# Patient Record
Sex: Male | Born: 2000
Health system: Southern US, Community
[De-identification: ages and names within clinical notes are randomized; demographics above are authoritative.]

## PROBLEM LIST (undated history)

## (undated) DIAGNOSIS — W3400XA Accidental discharge from unspecified firearms or gun, initial encounter: Secondary | ICD-10-CM

## (undated) DIAGNOSIS — Z789 Other specified health status: Secondary | ICD-10-CM

---
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
## 2024-07-27 ENCOUNTER — Emergency Department (HOSPITAL_COMMUNITY): Payer: Self-pay | Admitting: Certified Registered Nurse Anesthetist

## 2024-07-27 ENCOUNTER — Inpatient Hospital Stay (HOSPITAL_COMMUNITY)
Admission: EM | Admit: 2024-07-27 | Discharge: 2024-07-29 | DRG: 907 | Disposition: A | Discharge status: Home / Self Care | Payer: Self-pay | Attending: Surgery | Admitting: Surgery

## 2024-07-27 ENCOUNTER — Emergency Department (HOSPITAL_COMMUNITY): Payer: Self-pay

## 2024-07-27 ENCOUNTER — Encounter (HOSPITAL_COMMUNITY): Admission: EM | Disposition: A | Discharge status: Home / Self Care | Payer: Self-pay | Source: Home / Self Care

## 2024-07-27 ENCOUNTER — Encounter (HOSPITAL_COMMUNITY): Payer: Self-pay | Admitting: Emergency Medicine

## 2024-07-27 DIAGNOSIS — Y249XXA Unspecified firearm discharge, undetermined intent, initial encounter: Secondary | ICD-10-CM | POA: Diagnosis present

## 2024-07-27 DIAGNOSIS — T148XXA Other injury of unspecified body region, initial encounter: Secondary | ICD-10-CM | POA: Diagnosis present

## 2024-07-27 DIAGNOSIS — Z23 Encounter for immunization: Secondary | ICD-10-CM

## 2024-07-27 DIAGNOSIS — F1410 Cocaine abuse, uncomplicated: Secondary | ICD-10-CM | POA: Diagnosis present

## 2024-07-27 DIAGNOSIS — S75001A Unspecified injury of femoral artery, right leg, initial encounter: Secondary | ICD-10-CM

## 2024-07-27 DIAGNOSIS — S71101A Unspecified open wound, right thigh, initial encounter: Secondary | ICD-10-CM | POA: Diagnosis not present

## 2024-07-27 DIAGNOSIS — F1510 Other stimulant abuse, uncomplicated: Secondary | ICD-10-CM | POA: Diagnosis present

## 2024-07-27 DIAGNOSIS — Z7982 Long term (current) use of aspirin: Secondary | ICD-10-CM

## 2024-07-27 DIAGNOSIS — D62 Acute posthemorrhagic anemia: Secondary | ICD-10-CM | POA: Diagnosis present

## 2024-07-27 DIAGNOSIS — F1210 Cannabis abuse, uncomplicated: Secondary | ICD-10-CM | POA: Diagnosis present

## 2024-07-27 DIAGNOSIS — S75011A Minor laceration of femoral artery, right leg, initial encounter: Secondary | ICD-10-CM | POA: Diagnosis present

## 2024-07-27 DIAGNOSIS — S75191A Other specified injury of femoral vein at hip and thigh level, right leg, initial encounter: Secondary | ICD-10-CM | POA: Diagnosis not present

## 2024-07-27 DIAGNOSIS — S75091A Other specified injury of femoral artery, right leg, initial encounter: Secondary | ICD-10-CM | POA: Diagnosis not present

## 2024-07-27 HISTORY — PX: BYPASS GRAFT FEMORAL-PERONEAL: SHX5762

## 2024-07-27 HISTORY — PX: FASCIOTOMY: SHX132

## 2024-07-27 HISTORY — PX: VEIN HARVEST: SHX6363

## 2024-07-27 SURGERY — CREATION, BYPASS, ARTERIAL, FEMORAL TO PERONEAL, USING GRAFT
Anesthesia: General | Site: Leg Upper | Laterality: Right

## 2024-07-27 MED ORDER — ACETAMINOPHEN 500 MG PO TABS
1000.0000 mg | ORAL_TABLET | Freq: Four times a day (QID) | ORAL | Status: DC
  Administered 2024-07-27 – 2024-07-29 (×7): 1000 mg via ORAL

## 2024-07-27 MED ORDER — POLYETHYLENE GLYCOL 3350 17 G PO PACK
17.0000 g | PACK | Freq: Every day | ORAL | Status: DC
  Administered 2024-07-27 – 2024-07-29 (×2): 17 g via ORAL

## 2024-07-27 MED ORDER — DOCUSATE SODIUM 100 MG PO CAPS
100.0000 mg | ORAL_CAPSULE | Freq: Two times a day (BID) | ORAL | Status: DC
  Administered 2024-07-27 – 2024-07-29 (×4): 100 mg via ORAL

## 2024-07-27 MED ORDER — METOPROLOL TARTRATE 5 MG/5ML IV SOLN: 5.0000 mg | Freq: Four times a day (QID) | INTRAVENOUS | Status: DC | PRN

## 2024-07-27 MED ORDER — HYDRALAZINE HCL 20 MG/ML IJ SOLN: 10.0000 mg | INTRAMUSCULAR | Status: DC | PRN

## 2024-07-27 MED ORDER — OXYCODONE HCL 5 MG PO TABS
5.0000 mg | ORAL_TABLET | ORAL | Status: DC | PRN
  Administered 2024-07-27 (×2): 10 mg via ORAL
  Administered 2024-07-27: 5 mg via ORAL
  Administered 2024-07-27: 10 mg via ORAL
  Administered 2024-07-28 (×3): 5 mg via ORAL
  Administered 2024-07-29: 10 mg via ORAL

## 2024-07-27 MED ORDER — MORPHINE SULFATE (PF) 2 MG/ML IV SOLN
4.0000 mg | INTRAVENOUS | Status: DC | PRN
  Administered 2024-07-27: 4 mg via INTRAVENOUS

## 2024-07-27 MED ORDER — SODIUM CHLORIDE 0.9 % IV SOLN: 500.0000 mL | Freq: Once | INTRAVENOUS | Status: DC | PRN

## 2024-07-27 MED ORDER — METHOCARBAMOL 1000 MG/10ML IJ SOLN: 500.0000 mg | Freq: Three times a day (TID) | INTRAMUSCULAR | Status: DC

## 2024-07-27 MED ORDER — POLYETHYLENE GLYCOL 3350 17 G PO PACK: 17.0000 g | PACK | Freq: Every day | ORAL | Status: DC | PRN

## 2024-07-27 MED ORDER — GABAPENTIN 300 MG PO CAPS
300.0000 mg | ORAL_CAPSULE | Freq: Three times a day (TID) | ORAL | Status: DC
  Administered 2024-07-27 – 2024-07-29 (×7): 300 mg via ORAL

## 2024-07-27 MED ORDER — ONDANSETRON 4 MG PO TBDP: 4.0000 mg | ORAL_TABLET | Freq: Four times a day (QID) | ORAL | Status: DC | PRN

## 2024-07-27 MED ORDER — LACTATED RINGERS IV SOLN: INTRAVENOUS | Status: DC

## 2024-07-27 MED ORDER — ASPIRIN 81 MG PO TBEC
81.0000 mg | DELAYED_RELEASE_TABLET | Freq: Every day | ORAL | Status: DC
  Administered 2024-07-28 – 2024-07-29 (×2): 81 mg via ORAL

## 2024-07-27 MED ORDER — ONDANSETRON HCL 4 MG/2ML IJ SOLN: 4.0000 mg | Freq: Four times a day (QID) | INTRAMUSCULAR | Status: DC | PRN

## 2024-07-27 MED ORDER — METHOCARBAMOL 500 MG PO TABS
500.0000 mg | ORAL_TABLET | Freq: Three times a day (TID) | ORAL | Status: DC
  Administered 2024-07-27 – 2024-07-29 (×7): 500 mg via ORAL

## 2024-07-27 MED ORDER — ENOXAPARIN SODIUM 30 MG/0.3ML IJ SOSY
30.0000 mg | PREFILLED_SYRINGE | Freq: Two times a day (BID) | INTRAMUSCULAR | Status: DC
  Administered 2024-07-28 – 2024-07-29 (×3): 30 mg via SUBCUTANEOUS

## 2024-07-27 MED ADMIN — Cefazolin Sodium for IV Soln 2 GM and Dextrose 3% (50 ML): 2 g | INTRAVENOUS | NDC 00264310511

## 2024-07-27 MED ADMIN — Fentanyl Citrate Preservative Free (PF) Inj 250 MCG/5ML: 50 ug | INTRAVENOUS | NDC 72572017125

## 2024-07-27 MED ADMIN — Fentanyl Citrate Preservative Free (PF) Inj 250 MCG/5ML: 100 ug | INTRAVENOUS | NDC 72572017125

## 2024-07-27 MED ADMIN — henylephrine-NaCl Pref Syr 0.8 MG/10ML-0.9% (80 MCG/ML): 160 ug | INTRAVENOUS | NDC 65302050510

## 2024-07-27 MED ADMIN — Labetalol HCl IV Soln 5 MG/ML: 10 mg | INTRAVENOUS | NDC 00409233934

## 2024-07-27 MED ADMIN — Midazolam HCl Inj PF 2 MG/2ML (Base Equivalent): 2 mg | INTRAVENOUS | NDC 00409000125

## 2024-07-27 MED ADMIN — Heparin Sodium (Porcine) Inj 1000 Unit/ML: 8000 [IU] | INTRAVENOUS | NDC 71288040211

## 2024-07-27 MED ADMIN — Heparin Sodium (Porcine) Inj 1000 Unit/ML: 2000 [IU] | INTRAVENOUS | NDC 72572025001

## 2024-07-27 MED ADMIN — Lidocaine HCl Local Soln Prefilled Syringe 100 MG/5ML (2%): 60 mg | INTRAVENOUS | NDC 70004072309

## 2024-07-27 MED ADMIN — Fentanyl Citrate PF Soln Prefilled Syringe 50 MCG/ML: 50 ug | INTRAVENOUS | NDC 63323080801

## 2024-07-27 MED ADMIN — PROPOFOL 200 MG/20ML IV EMUL: 200 mg | INTRAVENOUS | NDC 00069020910

## 2024-07-27 MED ADMIN — Succinylcholine Chloride Sol Pref Syr 200 MG/10ML (20 MG/ML): 120 mg | INTRAVENOUS | NDC 99999070035

## 2024-07-27 MED ADMIN — Cefazolin Sodium-Dextrose IV Solution 2 GM/100ML-4%: 2 g | INTRAVENOUS | NDC 00338350841

## 2024-07-27 MED ADMIN — Rocuronium Bromide IV Soln Pref Syr 100 MG/10ML (10 MG/ML): 10 mg | INTRAVENOUS | NDC 99999070048

## 2024-07-27 MED ADMIN — Rocuronium Bromide IV Soln Pref Syr 100 MG/10ML (10 MG/ML): 20 mg | INTRAVENOUS | NDC 99999070048

## 2024-07-27 MED ADMIN — Rocuronium Bromide IV Soln Pref Syr 100 MG/10ML (10 MG/ML): 50 mg | INTRAVENOUS | NDC 99999070048

## 2024-07-27 MED ADMIN — Tet Tox-Diph-Acell Pertuss Ad Inj 5-2-15.5 LF-LF-MCG/0.5ML: 0.5 mL | INTRAMUSCULAR | NDC 49281040020

## 2024-07-27 MED ADMIN — Sugammadex Sodium IV 200 MG/2ML (Base Equivalent): 200 mg | INTRAVENOUS | NDC 00006542302

## 2024-07-27 MED ADMIN — Ondansetron HCl Inj 4 MG/2ML (2 MG/ML): 4 mg | INTRAVENOUS | NDC 60505613005

## 2024-07-27 MED ADMIN — Fentanyl Citrate Preservative Free (PF) Inj 100 MCG/2ML: 50 ug | INTRAVENOUS | NDC 72572017001

## 2024-07-27 MED ADMIN — Dexmedetomidine HCl in NaCl 0.9% IV Soln 80 MCG/20ML: 8 ug | INTRAVENOUS | NDC 00781349395

## 2024-07-27 MED ADMIN — Dexmedetomidine HCl in NaCl 0.9% IV Soln 80 MCG/20ML: 4 ug | INTRAVENOUS | NDC 00781349395

## 2024-07-27 MED ADMIN — Sodium Chloride Irrigation Soln 0.9%: 2000 mL | NDC 99999050048

## 2024-07-27 MED ADMIN — Dexamethasone Sod Phosphate Preservative Free Inj 10 MG/ML: 10 mg | INTRAVENOUS | NDC 25021005301

## 2024-07-27 MED ADMIN — Acetaminophen IV Soln 10 MG/ML: 1000 mg | INTRAVENOUS | NDC 63323043400

## 2024-07-27 MED ADMIN — Sodium Chloride IV Soln 0.9%: 1000 mL | INTRAVENOUS | NDC 00264580200

## 2024-07-27 MED ADMIN — Heparin Sodium (Porcine) Inj 1000 Unit/ML: 1 | NDC 99999070068

## 2024-07-27 MED ADMIN — Iohexol IV Soln 350 MG/ML: 100 mL | INTRAVENOUS | NDC 00407141491

## 2024-07-27 MED ADMIN — Protamine Sulfate Inj 10 MG/ML: 50 mg | INTRAVENOUS | NDC 63323022905

## 2024-07-27 SURGICAL SUPPLY — 46 items
BAG COUNTER SPONGE SURGICOUNT (BAG) ×2 IMPLANT
BNDG COMPR ESMARK 6X3 LF (GAUZE/BANDAGES/DRESSINGS) IMPLANT
BNDG ELASTIC 4INX 5YD STR LF (GAUZE/BANDAGES/DRESSINGS) IMPLANT
BNDG ELASTIC 6INX 5YD STR LF (GAUZE/BANDAGES/DRESSINGS) IMPLANT
BNDG GAUZE DERMACEA FLUFF 4 (GAUZE/BANDAGES/DRESSINGS) IMPLANT
CANISTER SUCTION 3000ML PPV (SUCTIONS) ×2 IMPLANT
CANNULA VESSEL 3MM 2 BLNT TIP (CANNULA) IMPLANT
CHLORAPREP W/TINT 26 (MISCELLANEOUS) ×4 IMPLANT
CUFF TOURN SGL QUICK 42 (TOURNIQUET CUFF) IMPLANT
CUFF TRNQT CYL 24X4X16.5-23 (TOURNIQUET CUFF) IMPLANT
CUFF TRNQT CYL 34X4.125X (TOURNIQUET CUFF) IMPLANT
DRAPE C-ARM 42X72 X-RAY (DRAPES) IMPLANT
DRAPE HALF SHEET 40X57 (DRAPES) IMPLANT
DRSG COVADERM 4X10 (GAUZE/BANDAGES/DRESSINGS) IMPLANT
DRSG COVADERM 4X8 (GAUZE/BANDAGES/DRESSINGS) IMPLANT
ELECTRODE REM PT RTRN 9FT ADLT (ELECTROSURGICAL) ×2 IMPLANT
EVACUATOR SILICONE 100CC (DRAIN) IMPLANT
GAUZE PAD ABD 8X10 STRL (GAUZE/BANDAGES/DRESSINGS) IMPLANT
GAUZE SPONGE 4X4 12PLY STRL (GAUZE/BANDAGES/DRESSINGS) ×2 IMPLANT
GLOVE BIO SURGEON STRL SZ8 (GLOVE) ×6 IMPLANT
GOWN STRL REUS W/ TWL LRG LVL3 (GOWN DISPOSABLE) ×4 IMPLANT
GOWN STRL REUS W/ TWL XL LVL3 (GOWN DISPOSABLE) ×2 IMPLANT
HEMOSTAT SNOW SURGICEL 2X4 (HEMOSTASIS) IMPLANT
INSERT FOGARTY SM (MISCELLANEOUS) IMPLANT
KIT BASIN OR (CUSTOM PROCEDURE TRAY) ×2 IMPLANT
KIT TURNOVER KIT B (KITS) ×2 IMPLANT
MARKER GRAFT CORONARY BYPASS (MISCELLANEOUS) IMPLANT
PACK PERIPHERAL VASCULAR (CUSTOM PROCEDURE TRAY) ×2 IMPLANT
PAD ARMBOARD POSITIONER FOAM (MISCELLANEOUS) ×4 IMPLANT
SET COLLECT BLD 21X3/4 12 (NEEDLE) IMPLANT
SET WALTER ACTIVATION W/DRAPE (SET/KITS/TRAYS/PACK) IMPLANT
SOLN 0.9% NACL POUR BTL 1000ML (IV SOLUTION) ×4 IMPLANT
SOLN STERILE WATER BTL 1000 ML (IV SOLUTION) ×2 IMPLANT
STAPLER SKIN PROX 35W (STAPLE) IMPLANT
STOPCOCK 4 WAY LG BORE MALE ST (IV SETS) IMPLANT
SUT ETHILON 3 0 PS 1 (SUTURE) IMPLANT
SUT MNCRL AB 4-0 PS2 18 (SUTURE) ×4 IMPLANT
SUT PROLENE 5 0 C 1 24 (SUTURE) ×2 IMPLANT
SUT PROLENE 6 0 BV (SUTURE) ×2 IMPLANT
SUT SILK 2 0 SH (SUTURE) ×2 IMPLANT
SUT SILK 3-0 18XBRD TIE 12 (SUTURE) IMPLANT
SUT VIC AB 2-0 CT1 TAPERPNT 27 (SUTURE) ×4 IMPLANT
SUT VIC AB 3-0 SH 27X BRD (SUTURE) ×4 IMPLANT
TOWEL GREEN STERILE (TOWEL DISPOSABLE) ×2 IMPLANT
TRAY FOLEY MTR SLVR 16FR STAT (SET/KITS/TRAYS/PACK) ×2 IMPLANT
UNDERPAD 30X36 HEAVY ABSORB (UNDERPADS AND DIAPERS) ×2 IMPLANT

## 2024-07-28 MED ORDER — VITAMIN C 500 MG PO TABS
500.0000 mg | ORAL_TABLET | Freq: Two times a day (BID) | ORAL | Status: DC
  Administered 2024-07-28 – 2024-07-29 (×3): 500 mg via ORAL

## 2024-07-28 MED ORDER — FERROUS SULFATE 325 (65 FE) MG PO TABS
325.0000 mg | ORAL_TABLET | Freq: Two times a day (BID) | ORAL | Status: DC
  Administered 2024-07-28 – 2024-07-29 (×2): 325 mg via ORAL

## 2024-07-28 MED ADMIN — Lactated Ringer's Solution: 1000 mL | INTRAVENOUS | NDC 00264775000

## 2024-07-29 ENCOUNTER — Encounter (HOSPITAL_COMMUNITY): Payer: Self-pay | Admitting: Vascular Surgery

## 2024-07-30 ENCOUNTER — Telehealth: Payer: Self-pay | Admitting: Vascular Surgery

## 2024-07-30 ENCOUNTER — Encounter (HOSPITAL_COMMUNITY): Payer: Self-pay | Admitting: Emergency Medicine
